# Patient Record
Sex: Female | Born: 1985 | Race: Black or African American | Hispanic: No | Marital: Single | Smoking: Never smoker
Health system: Southern US, Community
[De-identification: ages and names within clinical notes are randomized; demographics above are authoritative.]

## PROBLEM LIST (undated history)

## (undated) DIAGNOSIS — E282 Polycystic ovarian syndrome: Secondary | ICD-10-CM

## (undated) DIAGNOSIS — F329 Major depressive disorder, single episode, unspecified: Secondary | ICD-10-CM

## (undated) DIAGNOSIS — F32A Depression, unspecified: Secondary | ICD-10-CM

## (undated) HISTORY — DX: Major depressive disorder, single episode, unspecified: F32.9

## (undated) HISTORY — DX: Depression, unspecified: F32.A

## (undated) HISTORY — PX: OTHER SURGICAL HISTORY: SHX169

## (undated) HISTORY — DX: Polycystic ovarian syndrome: E28.2

---

## 2005-05-15 ENCOUNTER — Inpatient Hospital Stay (HOSPITAL_COMMUNITY): Admission: AD | Admit: 2005-05-15 | Discharge: 2005-05-15 | Payer: Self-pay | Admitting: Obstetrics & Gynecology

## 2007-11-16 ENCOUNTER — Emergency Department (HOSPITAL_COMMUNITY): Admission: EM | Admit: 2007-11-16 | Discharge: 2007-11-16 | Payer: Self-pay | Admitting: Emergency Medicine

## 2008-01-18 ENCOUNTER — Emergency Department (HOSPITAL_COMMUNITY): Admission: EM | Admit: 2008-01-18 | Discharge: 2008-01-18 | Payer: Self-pay | Admitting: Emergency Medicine

## 2008-09-11 ENCOUNTER — Emergency Department (HOSPITAL_COMMUNITY): Admission: EM | Admit: 2008-09-11 | Discharge: 2008-09-11 | Payer: Self-pay | Admitting: Family Medicine

## 2010-05-03 LAB — POCT PREGNANCY, URINE: Preg Test, Ur: NEGATIVE

## 2010-05-03 LAB — POCT I-STAT, CHEM 8
Creatinine, Ser: 0.7 mg/dL (ref 0.4–1.2)
Potassium: 4 mEq/L (ref 3.5–5.1)
Sodium: 140 mEq/L (ref 135–145)
TCO2: 26 mmol/L (ref 0–100)

## 2010-08-15 IMAGING — CR DG CHEST 2V
2 series · 2 of 2 positions shown · non-contrast
Comparison: None

CLINICAL DATA: Chest pain

CHEST - 2 VIEW

[w chest pa]
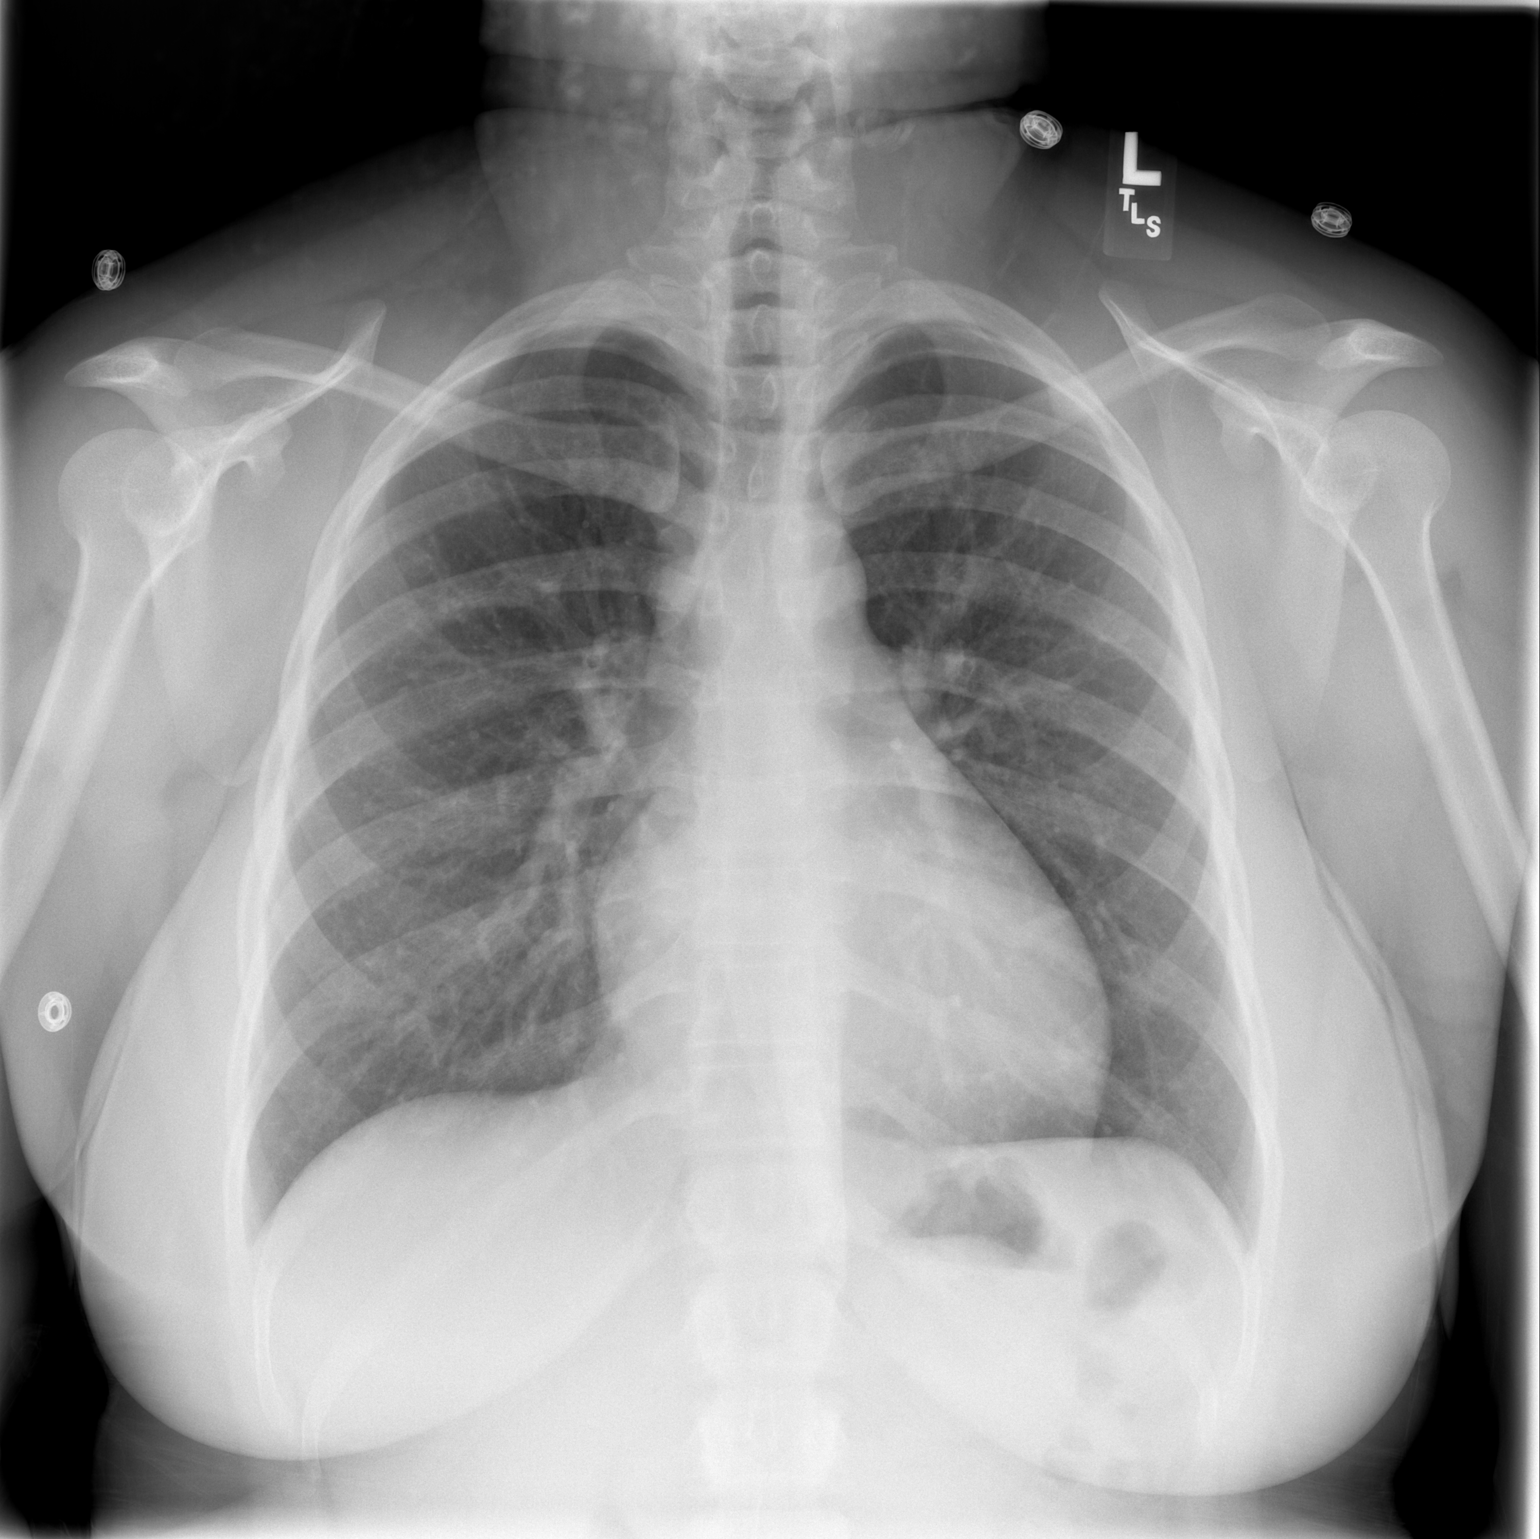

[w chest lat]
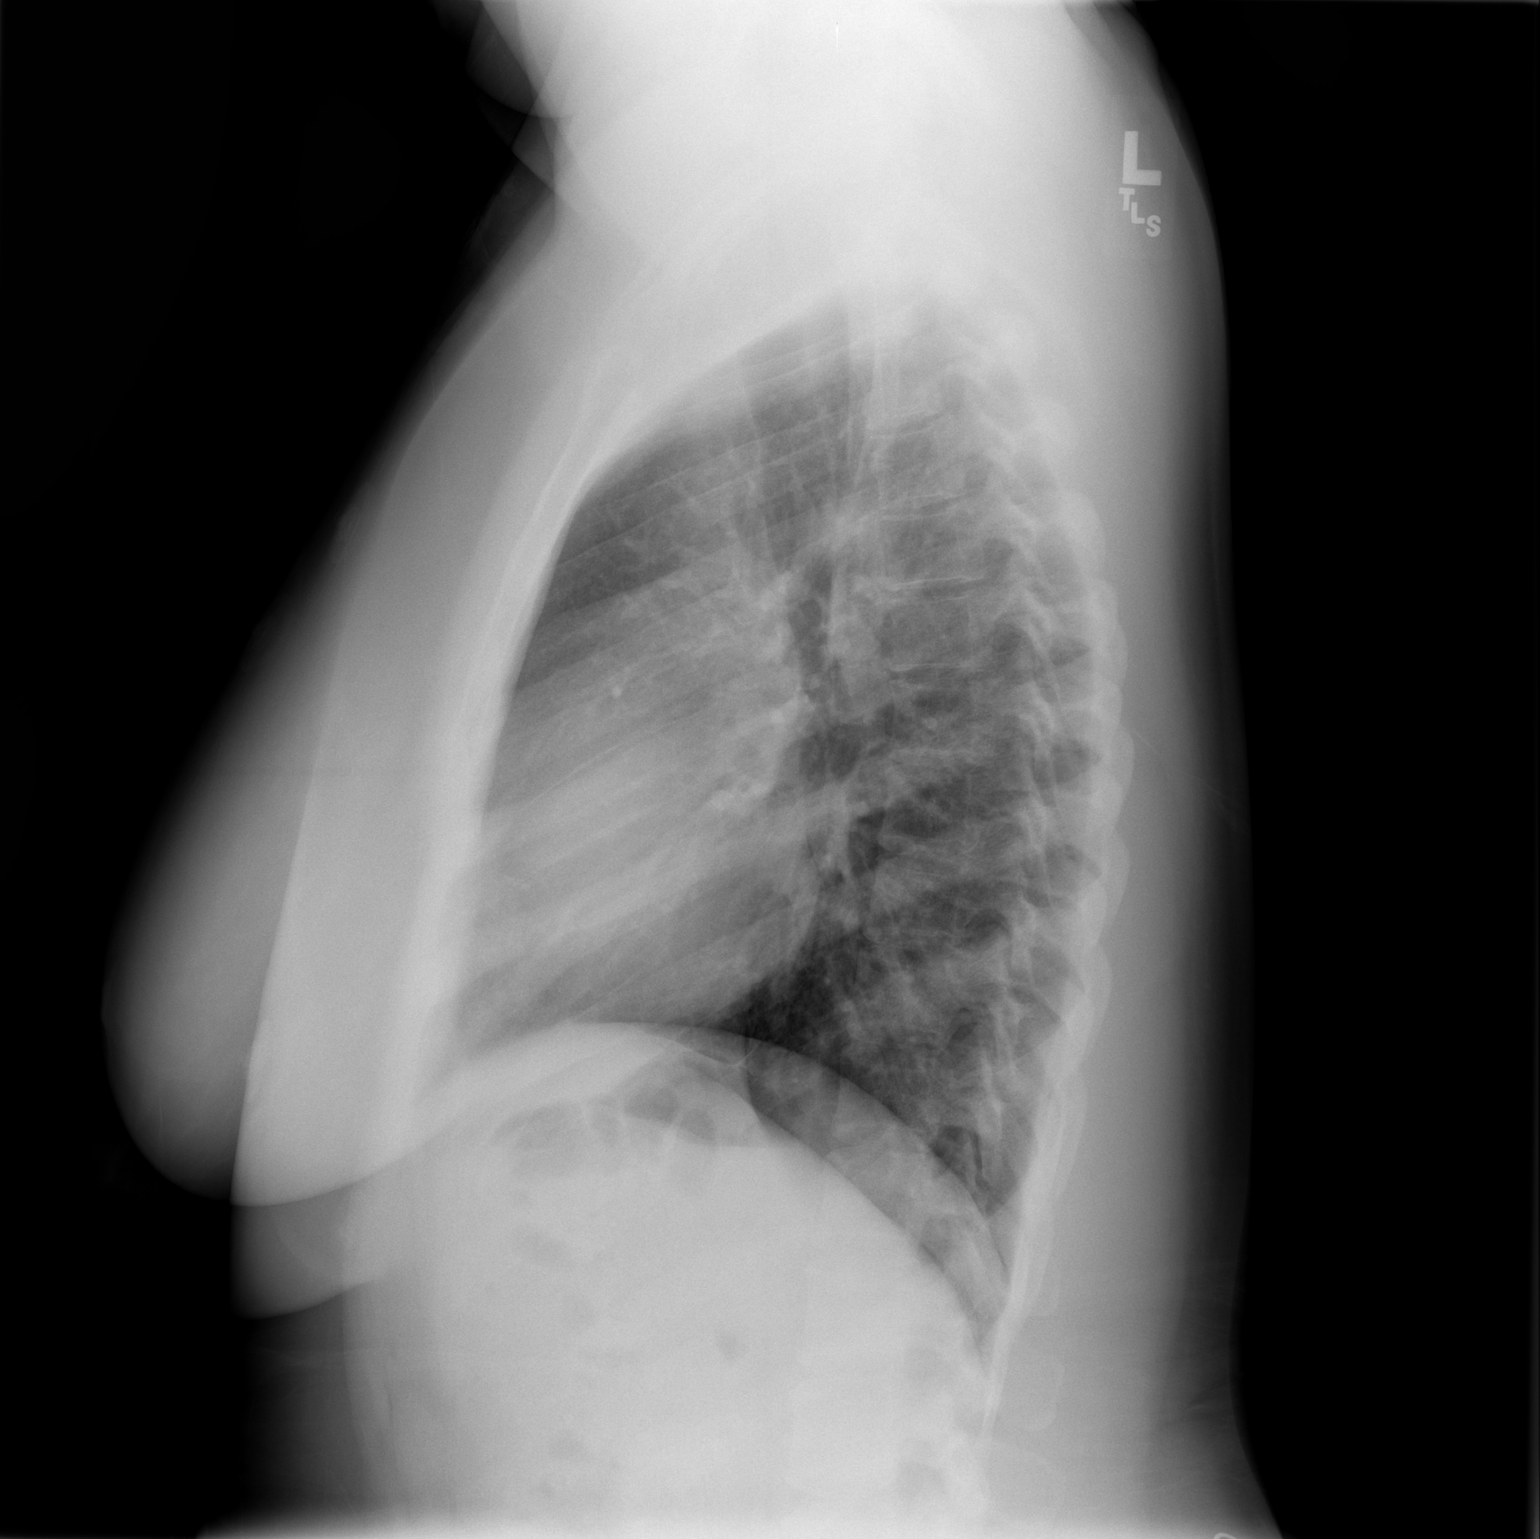

[2 of 2 positions shown; findings below may reference images not displayed]

FINDINGS: Heart size is normal.  The mediastinum is unremarkable.
Lungs are clear.  No effusions.  Bony structures unremarkable.
IMPRESSION: Normal chest

## 2011-05-14 ENCOUNTER — Ambulatory Visit: Payer: PRIVATE HEALTH INSURANCE | Admitting: Family Medicine

## 2011-05-14 VITALS — BP 127/84 | HR 109 | Temp 100.0°F | Resp 18 | Ht 63.5 in | Wt 225.0 lb

## 2011-05-14 DIAGNOSIS — R509 Fever, unspecified: Secondary | ICD-10-CM

## 2011-05-14 DIAGNOSIS — J029 Acute pharyngitis, unspecified: Secondary | ICD-10-CM

## 2011-05-14 LAB — POCT CBC
Granulocyte percent: 68.9 %G (ref 37–80)
HCT, POC: 39.6 % (ref 37.7–47.9)
Hemoglobin: 12.8 g/dL (ref 12.2–16.2)
MCH, POC: 26.4 pg — AB (ref 27–31.2)
MCHC: 32.3 g/dL (ref 31.8–35.4)
MCV: 81.7 fL (ref 80–97)
POC MID %: 8.3 %M (ref 0–12)
RBC: 4.85 M/uL (ref 4.04–5.48)
RDW, POC: 14.7 %
WBC: 6.7 10*3/uL (ref 4.6–10.2)

## 2011-05-14 MED ORDER — MELOXICAM 7.5 MG PO TABS: 7.5000 mg | ORAL_TABLET | Freq: Two times a day (BID) | ORAL | Status: AC

## 2011-05-14 MED ORDER — AZITHROMYCIN 250 MG PO TABS: ORAL_TABLET | ORAL | Status: AC

## 2011-05-14 NOTE — Progress Notes (Signed)
  Subjective:    Patient ID: Connie Dennis, female    DOB: 1985/10/08, 26 y.o.   MRN: [id number]  HPI  Patient presents 2 day history of fever, chills, ST and myalgas  Difficult to swallow, occ cough Malaise  Works at [employer]; multiple sick contacts            Review of Systems     Objective:   Physical Exam  Constitutional: She appears well-developed.  HENT:  Nose: Rhinorrhea (clear) present.  Mouth/Throat: Posterior oropharyngeal erythema: clear PND.  Eyes: Conjunctivae are normal.  Neck: Neck supple.  Cardiovascular: Normal rate, regular rhythm and normal heart sounds.   Pulmonary/Chest: Effort normal and breath sounds normal.  Lymphadenopathy:    She has cervical adenopathy (shoddly anterior nodes).  Neurological: She is alert.  Skin: No rash noted.          Results for orders placed in visit on 05/14/11  POCT CBC      Component Value Range   WBC 6.7  4.6 - 10.2 (K/uL)   Lymph, poc 1.5  0.6 - 3.4    POC LYMPH PERCENT 22.8  10 - 50 (%L)   MID (cbc) 0.6  0 - 0.9    POC MID % 8.3  0 - 12 (%M)   POC Granulocyte 4.6  2 - 6.9    Granulocyte percent 68.9  37 - 80 (%G)   RBC 4.85  4.04 - 5.48 (M/uL)   Hemoglobin 12.8  12.2 - 16.2 (g/dL)   HCT, POC 30.8  65.7 - 47.9 (%)   MCV 81.7  80 - 97 (fL)   MCH, POC 26.4 (*) 27 - 31.2 (pg)   MCHC 32.3  31.8 - 35.4 (g/dL)   RDW, POC 84.6     Platelet Count, POC 288  142 - 424 (K/uL)   MPV 9.2  0 - 99.8 (fL)    Assessment & Plan:   1. Fever  POCT CBC, meloxicam (MOBIC) 7.5 MG tablet  2. Pharyngitis  azithromycin (ZITHROMAX) 250 MG tablet, meloxicam (MOBIC) 7.5 MG tablet   Anticipatory guidance See meds on AVS

## 2013-07-23 ENCOUNTER — Emergency Department (HOSPITAL_COMMUNITY): Payer: BC Managed Care – PPO

## 2013-07-23 ENCOUNTER — Emergency Department (HOSPITAL_COMMUNITY)
Admission: EM | Admit: 2013-07-23 | Discharge: 2013-07-23 | Disposition: A | Discharge status: Home / Self Care | Payer: BC Managed Care – PPO | Attending: Emergency Medicine | Admitting: Emergency Medicine

## 2013-07-23 ENCOUNTER — Encounter (HOSPITAL_COMMUNITY): Payer: Self-pay | Admitting: Emergency Medicine

## 2013-07-23 DIAGNOSIS — Z87891 Personal history of nicotine dependence: Secondary | ICD-10-CM | POA: Insufficient documentation

## 2013-07-23 DIAGNOSIS — E669 Obesity, unspecified: Secondary | ICD-10-CM | POA: Insufficient documentation

## 2013-07-23 DIAGNOSIS — R0789 Other chest pain: Secondary | ICD-10-CM

## 2013-07-23 DIAGNOSIS — I517 Cardiomegaly: Secondary | ICD-10-CM

## 2013-07-23 LAB — COMPREHENSIVE METABOLIC PANEL
ALK PHOS: 91 U/L (ref 39–117)
ALT: 16 U/L (ref 0–35)
AST: 14 U/L (ref 0–37)
Albumin: 3.4 g/dL — ABNORMAL LOW (ref 3.5–5.2)
BUN: 10 mg/dL (ref 6–23)
CALCIUM: 9.1 mg/dL (ref 8.4–10.5)
CO2: 26 mEq/L (ref 19–32)
CREATININE: 0.74 mg/dL (ref 0.50–1.10)
Chloride: 99 mEq/L (ref 96–112)
GFR calc Af Amer: >90 mL/min (ref >90)
Glucose, Bld: 278 mg/dL — ABNORMAL HIGH (ref 70–99)
POTASSIUM: 4.1 meq/L (ref 3.7–5.3)
SODIUM: 136 meq/L — AB (ref 137–147)
Total Bilirubin: <0.2 mg/dL — ABNORMAL LOW (ref 0.3–1.2)
Total Protein: 7.9 g/dL (ref 6.0–8.3)

## 2013-07-23 LAB — CBC
HCT: 34.6 % — ABNORMAL LOW (ref 36.0–46.0)
Hemoglobin: 11.3 g/dL — ABNORMAL LOW (ref 12.0–15.0)
MCH: 24.6 pg — ABNORMAL LOW (ref 26.0–34.0)
MCHC: 32.7 g/dL (ref 30.0–36.0)
MCV: 75.2 fL — ABNORMAL LOW (ref 78.0–100.0)
PLATELETS: 296 10*3/uL (ref 150–400)
RBC: 4.6 MIL/uL (ref 3.87–5.11)
RDW: 14.1 % (ref 11.5–15.5)
WBC: 7.6 10*3/uL (ref 4.0–10.5)

## 2013-07-23 LAB — I-STAT TROPONIN, ED: Troponin i, poc: 0 ng/mL (ref 0.00–0.08)

## 2013-07-23 LAB — PRO B NATRIURETIC PEPTIDE: PRO B NATRI PEPTIDE: 12 pg/mL (ref 0–125)

## 2013-07-23 NOTE — Discharge Instructions (Signed)
If you were given medicines take as directed.  If you are on coumadin or contraceptives realize their levels and effectiveness is altered by many different medicines.  If you have any reaction (rash, tongues swelling, other) to the medicines stop taking and see a physician.   Please follow up as directed and return to the ER or see a physician for new or worsening symptoms.  Thank you. Filed Vitals:   07/23/13 0155  BP: 149/86  Pulse: 94  Temp: 98.5 F (36.9 C)  TempSrc: Oral  Resp: 20  Weight: 245 lb (111.131 kg)  SpO2: 100%    Chest Pain (Nonspecific) It is often hard to give a diagnosis for the cause of chest pain. There is always a chance that your pain could be related to something serious, such as a heart attack or a blood clot in the lungs. You need to follow up with your doctor. HOME CARE  If antibiotic medicine was given, take it as directed by your doctor. Finish the medicine even if you start to feel better.  For the next few days, avoid activities that bring on chest pain. Continue physical activities as told by your doctor.  Do not use any tobacco products. This includes cigarettes, chewing tobacco, and e-cigarettes.  Avoid drinking alcohol.  Only take medicine as told by your doctor.  Follow your doctor's suggestions for more testing if your chest pain does not go away.  Keep all doctor visits you made. GET HELP IF:  Your chest pain does not go away, even after treatment.  You have a rash with blisters on your chest.  You have a fever. GET HELP RIGHT AWAY IF:   You have more pain or pain that spreads to your arm, neck, jaw, back, or belly (abdomen).  You have shortness of breath.  You cough more than usual or cough up blood.  You have very bad back or belly pain.  You feel sick to your stomach (nauseous) or throw up (vomit).  You have very bad weakness.  You pass out (faint).  You have chills. This is an emergency. Do not wait to see if the  problems will go away. Call your local emergency services (911 in [place] Do not drive yourself to the hospital. MAKE SURE YOU:   Understand these instructions.  Will watch your condition.  Will get help right away if you are not doing well or get worse. Document Released: 07/01/2007 Document Revised: 01/17/2013 Document Reviewed: 07/01/2007 [facility] Patient Information 2015 [place], [place]. This information is not intended to replace advice given to you by your health care provider. Make sure you discuss any questions you have with your health care provider.

## 2013-07-23 NOTE — ED Notes (Signed)
Pt arrived to the ED with a complaint of having chest pain.  Pt began having chest pain 20 minutes ago.  Pt states the pain is more like a cramping sensation.  Pt states she got short of breath two days ago.  Pt states the shortness of breath after she eats.

## 2013-07-23 NOTE — ED Provider Notes (Signed)
CSN: [id number]     Arrival date & time 07/23/13  0149 History   First MD Initiated Contact with Patient 07/23/13 0234     Chief Complaint  Patient presents with  . Chest Pain     (Consider location/radiation/quality/duration/timing/severity/associated sxs/prior Treatment) HPI Comments: 28 year old female with PCO S. and obesity presents after 3 minute episode of right-sided chest tightness that resolved on its own. No recent or current exertional symptoms or diaphoresis. No known heart problems or blood clot history.Patient denies blood clot history, active cancer, recent major trauma or surgery, unilateral leg swelling/ pain, recent long travel, hemoptysis or oral contraceptives. Patient currently has no symptoms. Patient had mild shortness of breath intermittent past 2 days that occurs after eating. Mild bloating sensation upper abdomen without known gallstone or gallbladder problems.   Patient is a 28 y.o. female presenting with chest pain. The history is provided by the patient.  Chest Pain Associated symptoms: shortness of breath   Associated symptoms: no abdominal pain, no back pain, no fever, no headache and not vomiting     No past medical history on file. No past surgical history on file. No family history on file. History  Substance Use Topics  . Smoking status: Former Games developermoker  . Smokeless tobacco: Not on file  . Alcohol Use: Yes   OB History   Grav Para Term Preterm Abortions TAB SAB Ect Mult Living                 Review of Systems  Constitutional: Negative for fever and chills.  HENT: Negative for congestion.   Eyes: Negative for visual disturbance.  Respiratory: Positive for shortness of breath.   Cardiovascular: Positive for chest pain. Negative for leg swelling.  Gastrointestinal: Negative for vomiting and abdominal pain.  Genitourinary: Negative for dysuria and flank pain.  Musculoskeletal: Negative for back pain, neck pain and neck stiffness.  Skin:  Negative for rash.  Neurological: Negative for light-headedness and headaches.      Allergies  Review of patient's allergies indicates no known allergies.  Home Medications   Prior to Admission medications   Not on File   BP 149/86  Pulse 94  Temp(Src) 98.5 F (36.9 C) (Oral)  Resp 20  Wt 245 lb (111.131 kg)  SpO2 100%  LMP 07/23/2013 Physical Exam  Nursing note and vitals reviewed. Constitutional: She is oriented to person, place, and time. She appears well-developed and well-nourished.  HENT:  Head: Normocephalic and atraumatic.  Eyes: Conjunctivae are normal. Right eye exhibits no discharge. Left eye exhibits no discharge.  Neck: Normal range of motion. Neck supple. No tracheal deviation present.  Cardiovascular: Normal rate, regular rhythm and intact distal pulses.   Pulmonary/Chest: Effort normal and breath sounds normal.  Abdominal: Soft. She exhibits no distension. There is no tenderness. There is no guarding.  Musculoskeletal: She exhibits no edema and no tenderness.  Neurological: She is alert and oriented to person, place, and time.  Skin: Skin is warm. No rash noted.  Psychiatric: She has a normal mood and affect.    ED Course  Procedures (including critical care time)  EMERGENCY DEPARTMENT BILIARY ULTRASOUND INTERPRETATION "Study: Limited Abdominal Ultrasound of the gallbladder and common bile duct."  INDICATIONS: upper abd fullness with eating Indication: Multiple views of the gallbladder and common bile duct were obtained in real-time with a Multi-frequency probe." PERFORMED BY:  Myself IMAGES ARCHIVED?: Yes FINDINGS: Gallstones absent and Gallbladder wall normal in thickness LIMITATIONS: Body Habitus and Bowel Gas INTERPRETATION: Normal  Labs Review Labs Reviewed  CBC - Abnormal; Notable for the following:    Hemoglobin 11.3 (*)    HCT 34.6 (*)    MCV 75.2 (*)    MCH 24.6 (*)    All other components within normal limits  PRO B NATRIURETIC  PEPTIDE  PREGNANCY, URINE  COMPREHENSIVE METABOLIC PANEL  I-STAT TROPOININ, ED    Imaging Review Dg Chest 2 View  07/23/2013   CLINICAL DATA:  Chest pain, shortness of breath and dizziness.  EXAM: CHEST  2 VIEW  COMPARISON:  Chest radiograph November 16, 2007  FINDINGS: Cardiac silhouette is upper limits of normal in size, mediastinal silhouette is nonsuspicious. Central prominence of the bronchovascular markings without pleural effusions or focal consolidations. Trachea projects midline and there is no pneumothorax. Soft tissue planes and included osseous structures are nonsuspicious.  IMPRESSION: Borderline cardiomegaly. Prominent central bronchovascular markings could reflect venous congestion, reactive airway disease or possible bronchitis. No focal consolidation.   Electronically Signed   By: Awilda Metroourtnay  Bloomer   On: 07/23/2013 03:39     EKG Interpretation   Date/Time:  Sunday July 23 2013 02:06:39 EDT Ventricular Rate:  100 PR Interval:  153 QRS Duration: 79 QT Interval:  354 QTC Calculation: 457 R Axis:   54 Text Interpretation:  No acute findings Confirmed by Nishat Livingston  MD, Torian Thoennes  ([id number]) on 07/23/2013 4:22:38 AM      MDM   Final diagnoses:  Atypical chest pain  Cardiomegaly   Well-appearing female with obesity as risk factor for cardiac disease and no risk factors for blood clots. Currently asymptomatic. Patient is perc negative (hr when I was in the room 90) no PE risks and no d-dimer indicated at this time. EKG reviewed no acute findings. Plan for one screening troponin at approximately 2 hours which will be approximately 3 hours since her brief atypical chest tightness. Patient very well-appearing in ER and I discussed outpatient followup. Bedside ultrasound done no gallstones visualized.  Patient has no chest pain the ER no symptoms. Followup outpatient discussed Results and differential diagnosis were discussed with the patient/parent/guardian. Close follow up outpatient  was discussed, comfortable with the plan.   Medications - No data to display  Filed Vitals:   07/23/13 0155  BP: 149/86  Pulse: 94  Temp: 98.5 F (36.9 C)  TempSrc: Oral  Resp: 20  Weight: 245 lb (111.131 kg)  SpO2: 100%        Enid SkeensJoshua M Gabriella Woodhead, MD 07/23/13 0423

## 2013-07-25 ENCOUNTER — Encounter: Payer: Self-pay | Admitting: Cardiovascular Disease

## 2013-07-25 ENCOUNTER — Emergency Department (HOSPITAL_COMMUNITY)
Admission: EM | Admit: 2013-07-25 | Discharge: 2013-07-25 | Disposition: A | Discharge status: Home / Self Care | Payer: BC Managed Care – PPO | Attending: Emergency Medicine | Admitting: Emergency Medicine

## 2013-07-25 ENCOUNTER — Ambulatory Visit (INDEPENDENT_AMBULATORY_CARE_PROVIDER_SITE_OTHER): Payer: BC Managed Care – PPO | Admitting: Cardiovascular Disease

## 2013-07-25 ENCOUNTER — Encounter (HOSPITAL_COMMUNITY): Payer: Self-pay | Admitting: Emergency Medicine

## 2013-07-25 VITALS — BP 110/76 | HR 84 | Ht 63.0 in | Wt 243.0 lb

## 2013-07-25 DIAGNOSIS — R110 Nausea: Secondary | ICD-10-CM | POA: Insufficient documentation

## 2013-07-25 DIAGNOSIS — Z79899 Other long term (current) drug therapy: Secondary | ICD-10-CM | POA: Insufficient documentation

## 2013-07-25 DIAGNOSIS — R141 Gas pain: Secondary | ICD-10-CM | POA: Insufficient documentation

## 2013-07-25 DIAGNOSIS — R072 Precordial pain: Secondary | ICD-10-CM

## 2013-07-25 DIAGNOSIS — Z87891 Personal history of nicotine dependence: Secondary | ICD-10-CM | POA: Insufficient documentation

## 2013-07-25 DIAGNOSIS — R143 Flatulence: Secondary | ICD-10-CM

## 2013-07-25 DIAGNOSIS — I517 Cardiomegaly: Secondary | ICD-10-CM

## 2013-07-25 DIAGNOSIS — R0789 Other chest pain: Secondary | ICD-10-CM

## 2013-07-25 DIAGNOSIS — R079 Chest pain, unspecified: Secondary | ICD-10-CM | POA: Insufficient documentation

## 2013-07-25 DIAGNOSIS — R142 Eructation: Secondary | ICD-10-CM

## 2013-07-25 LAB — CBC
HEMATOCRIT: 36.1 % (ref 36.0–46.0)
Hemoglobin: 12.1 g/dL (ref 12.0–15.0)
MCH: 25.2 pg — ABNORMAL LOW (ref 26.0–34.0)
MCHC: 33.5 g/dL (ref 30.0–36.0)
MCV: 75.1 fL — AB (ref 78.0–100.0)
Platelets: 321 10*3/uL (ref 150–400)
RBC: 4.81 MIL/uL (ref 3.87–5.11)
RDW: 14.2 % (ref 11.5–15.5)
WBC: 9 10*3/uL (ref 4.0–10.5)

## 2013-07-25 LAB — BASIC METABOLIC PANEL
BUN: 10 mg/dL (ref 6–23)
CHLORIDE: 97 meq/L (ref 96–112)
CO2: 25 mEq/L (ref 19–32)
Calcium: 9.3 mg/dL (ref 8.4–10.5)
Creatinine, Ser: 0.79 mg/dL (ref 0.50–1.10)
GFR calc non Af Amer: >90 mL/min (ref >90)
GLUCOSE: 152 mg/dL — AB (ref 70–99)
POTASSIUM: 3.6 meq/L — AB (ref 3.7–5.3)
SODIUM: 135 meq/L — AB (ref 137–147)

## 2013-07-25 LAB — I-STAT TROPONIN, ED: TROPONIN I, POC: 0.01 ng/mL (ref 0.00–0.08)

## 2013-07-25 LAB — PRO B NATRIURETIC PEPTIDE: Pro B Natriuretic peptide (BNP): 7 pg/mL (ref 0–125)

## 2013-07-25 MED ORDER — POTASSIUM CHLORIDE CRYS ER 20 MEQ PO TBCR
40.0000 meq | EXTENDED_RELEASE_TABLET | Freq: Once | ORAL | Status: AC
  Administered 2013-07-25: 40 meq via ORAL
  Filled 2013-07-25: qty 2

## 2013-07-25 NOTE — Patient Instructions (Addendum)
Your physician recommends that you schedule a follow-up appointment in:  About 6- 8  weeks.   Your physician has requested that you have an echocardiogram. Echocardiography is a painless test that uses sound waves to create images of your heart. It provides your doctor with information about the size and shape of your heart and how well your heart's chambers and valves are working. This procedure takes approximately one hour. There are no restrictions for this procedure.   Try over the counter Prilosec if needed.

## 2013-07-25 NOTE — Progress Notes (Signed)
     History of Present Illness: 28 yo female with history of obesity, polycystic ovarian syndrome here today as a new patient for evaluation of chest pain. She tells me that she had dizziness and SOB beginning four days ago. She was seen in the [facility] two days ago and had a normal evaluation. She had onset of chest pain last night while lying in bed. The pain was located in the right and left chest wall and happened   Primary Care Physician: None  Past Medical History  Diagnosis Date  . Polycystic ovarian syndrome     Past Surgical History  Procedure Laterality Date  . None      Current Outpatient Prescriptions  Medication Sig Dispense Refill  . GARCINIA CAMBOGIA-CHROMIUM PO Take 1 capsule by mouth 2 (two) times daily.       No current facility-administered medications for this visit.    No Known Allergies  History   Social History  . Marital Status: Single    Spouse Name: N/A    Number of Children: 0  . Years of Education: N/A   Occupational History  . High School Teacher-Social Studie/English    Social History Main Topics  . Smoking status: Never Smoker   . Smokeless tobacco: Not on file  . Alcohol Use: 0.5 oz/week    1 drink(s) per week  . Drug Use: No  . Sexual Activity: Yes   Other Topics Concern  . Not on file   Social History Narrative  . No narrative on file    Family History  Problem Relation Age of Onset  . Diabetes Paternal Grandmother   . Cancer Mother     Breast cancer  . CAD Neg Hx     Review of Systems:  As stated in the HPI and otherwise negative.   BP 110/76  Pulse 84  Ht 5\' 3"  (1.6 m)  Wt 243 lb (110.224 kg)  BMI 43.06 kg/m2  LMP 07/23/2013  Physical Examination: General: Well developed, well nourished, NAD HEENT: OP clear, mucus membranes moist SKIN: warm, dry. No rashes. Neuro: No focal deficits Musculoskeletal: Muscle strength 5/5 all ext Psychiatric: Mood and affect normal Neck: No JVD, no carotid bruits, no  thyromegaly, no lymphadenopathy. Lungs:Clear bilaterally, no wheezes, rhonci, crackles Cardiovascular: Regular rate and rhythm. No murmurs, gallops or rubs. Abdomen:Soft. Bowel sounds present. Non-tender.  Extremities: No lower extremity edema. Pulses are 2 + in the bilateral DP/PT.  EKG: NSR, no ischemic changes (EKG from ED this am)  Assessment and Plan:   1. Chest pain: Atypical. I do not think it is cardiac related. Occurred during rest after heavy meal while lying in bed. LIkely GERD. Will have her try Prilosec OTC. Will arrange echo to assess LV function and LV size.   2. Cardiomegaly on Chest xray: Will assess LV size with echo.

## 2013-07-25 NOTE — ED Notes (Signed)
Patient c/o bilateral chest pain radiating into her underarms. Patient seen 2 days ago for same, tonight she states the pain is burning and awakened her suddenly @ 1 hour ago.

## 2013-07-25 NOTE — ED Notes (Signed)
Pt reports waking up about an hour ago with chest pain/tightness, sts had similar episode 2 days ago and was told her heart is enlarged. Has an appointment with cardiologist this week. Pt denies any pain at this time, sts it's intermittent.

## 2013-07-25 NOTE — ED Provider Notes (Signed)
Medical screening examination/treatment/procedure(s) were performed by non-physician practitioner and as supervising physician I was immediately available for consultation/collaboration.   EKG Interpretation   Date/Time:  Tuesday July 25 2013 01:57:27 EDT Ventricular Rate:  83 PR Interval:  145 QRS Duration: 81 QT Interval:  365 QTC Calculation: 429 R Axis:   48 Text Interpretation:  Sinus rhythm Baseline wander in lead(s) V2 No  significant change since last tracing Confirmed by OTTER  MD, OLGA ([id number])  on 07/25/2013 4:45:24 AM       Olivia Mackielga M Otter, MD 07/25/13 [phone]

## 2013-07-25 NOTE — Discharge Instructions (Signed)
Your testing today has not shown any signs for a concerning or emergent cause for your chest pain. Please followup with your cardiologist today as planned.    Chest Pain (Nonspecific) It is often hard to give a specific diagnosis for the cause of chest pain. There is always a chance that your pain could be related to something serious, such as a heart attack or a blood clot in the lungs. You need to follow up with your health care provider for further evaluation. CAUSES   Heartburn.  Pneumonia or bronchitis.  Anxiety or stress.  Inflammation around your heart (pericarditis) or lung (pleuritis or pleurisy).  A blood clot in the lung.  A collapsed lung (pneumothorax). It can develop suddenly on its own (spontaneous pneumothorax) or from trauma to the chest.  Shingles infection (herpes zoster virus). The chest wall is composed of bones, muscles, and cartilage. Any of these can be the source of the pain.  The bones can be bruised by injury.  The muscles or cartilage can be strained by coughing or overwork.  The cartilage can be affected by inflammation and become sore (costochondritis). DIAGNOSIS  Lab tests or other studies may be needed to find the cause of your pain. Your health care provider may have you take a test called an ambulatory electrocardiogram (ECG). An ECG records your heartbeat patterns over a 24-hour period. You may also have other tests, such as:  Transthoracic echocardiogram (TTE). During echocardiography, sound waves are used to evaluate how blood flows through your heart.  Transesophageal echocardiogram (TEE).  Cardiac monitoring. This allows your health care provider to monitor your heart rate and rhythm in real time.  Holter monitor. This is a portable device that records your heartbeat and can help diagnose heart arrhythmias. It allows your health care provider to track your heart activity for several days, if needed.  Stress tests by exercise or by giving  medicine that makes the heart beat faster. TREATMENT   Treatment depends on what may be causing your chest pain. Treatment may include:  Acid blockers for heartburn.  Anti-inflammatory medicine.  Pain medicine for inflammatory conditions.  Antibiotics if an infection is present.  You may be advised to change lifestyle habits. This includes stopping smoking and avoiding alcohol, caffeine, and chocolate.  You may be advised to keep your head raised (elevated) when sleeping. This reduces the chance of acid going backward from your stomach into your esophagus. Most of the time, nonspecific chest pain will improve within 2-3 days with rest and mild pain medicine.  HOME CARE INSTRUCTIONS   If antibiotics were prescribed, take them as directed. Finish them even if you start to feel better.  For the next few days, avoid physical activities that bring on chest pain. Continue physical activities as directed.  Do not use any tobacco products, including cigarettes, chewing tobacco, or electronic cigarettes.  Avoid drinking alcohol.  Only take medicine as directed by your health care provider.  Follow your health care provider's suggestions for further testing if your chest pain does not go away.  Keep any follow-up appointments you made. If you do not go to an appointment, you could develop lasting (chronic) problems with pain. If there is any problem keeping an appointment, call to reschedule. SEEK MEDICAL CARE IF:   Your chest pain does not go away, even after treatment.  You have a rash with blisters on your chest.  You have a fever. SEEK IMMEDIATE MEDICAL CARE IF:   You have increased  chest pain or pain that spreads to your arm, neck, jaw, back, or abdomen.  You have shortness of breath.  You have an increasing cough, or you cough up blood.  You have severe back or abdominal pain.  You feel nauseous or vomit.  You have severe weakness.  You faint.  You have  chills. This is an emergency. Do not wait to see if the pain will go away. Get medical help at once. Call your local emergency services (911 in [place] Do not drive yourself to the hospital. MAKE SURE YOU:   Understand these instructions.  Will watch your condition.  Will get help right away if you are not doing well or get worse. Document Released: 10/22/2004 Document Revised: 01/17/2013 Document Reviewed: 08/18/2007 [facility] Patient Information 2015 [place], [place]. This information is not intended to replace advice given to you by your health care provider. Make sure you discuss any questions you have with your health care provider.

## 2013-07-25 NOTE — ED Provider Notes (Signed)
CSN: [id number]     Arrival date & time 07/25/13  0150 History   First MD Initiated Contact with Patient 07/25/13 0201     Chief Complaint  Patient presents with  . Chest Pain   HPI  History provided by the patient. Patient is a 28 year old female with no significant PMH presenting with complaints of intermittent episodic chest pains. Patient reports waking up around 1 AM with a burning chest pain in the left side radiating up and over her shoulder to the back. Symptoms lasted only a few minutes and resolved. Since then she has had intermittent similar burning pains to bilateral left and right chest radiating up and over both shoulders to the back similar to a backpack strap. Symptoms last only a few minutes and then resolve for about 10 minutes and may return at any time. They do not seem worse with any bending moving or twisting. They do not seem worse with any deep breathing. Denies any associated shortness of breath. Did have some belching and slight nausea. No vomiting. No diaphoresis. She has not had any recent long travel. No swelling in the extremities. No weight changes. No orthopnea or PND.    History reviewed. No pertinent past medical history. History reviewed. No pertinent past surgical history. No family history on file. History  Substance Use Topics  . Smoking status: Former Games developermoker  . Smokeless tobacco: Not on file  . Alcohol Use: No   OB History   Grav Para Term Preterm Abortions TAB SAB Ect Mult Living                 Review of Systems  Constitutional: Negative for fever, chills and diaphoresis.  HENT: Negative for congestion and rhinorrhea.   Respiratory: Negative for cough and shortness of breath.   Cardiovascular: Positive for chest pain. Negative for palpitations and leg swelling.  Gastrointestinal: Positive for nausea. Negative for vomiting, abdominal pain and diarrhea.  All other systems reviewed and are negative.     Allergies  Review of patient's  allergies indicates no known allergies.  Home Medications   Prior to Admission medications   Medication Sig Start Date End Date Taking? Authorizing Provider  GARCINIA CAMBOGIA-CHROMIUM PO Take 1 capsule by mouth 2 (two) times daily.    Historical Provider, MD   BP 139/92  Pulse 83  Temp(Src) 97.3 F (36.3 C) (Oral)  Resp 13  Ht 5\' 3"  (1.6 m)  Wt 245 lb (111.131 kg)  BMI 43.41 kg/m2  SpO2 100%  LMP 07/23/2013 Physical Exam  Nursing note and vitals reviewed. Constitutional: She is oriented to person, place, and time. She appears well-developed and well-nourished. No distress.  HENT:  Head: Normocephalic.  Cardiovascular: Normal rate and regular rhythm.   Pulmonary/Chest: Effort normal and breath sounds normal. No respiratory distress. She has no wheezes. She has no rales.  Abdominal: Soft. There is no tenderness.  Neurological: She is alert and oriented to person, place, and time.  Skin: Skin is warm and dry. No rash noted.  Psychiatric: She has a normal mood and affect. Her behavior is normal.    ED Course  Procedures  COORDINATION OF CARE:  Nursing notes reviewed. Vital signs reviewed. Initial pt interview and examination performed.   Filed Vitals:   07/25/13 0156  BP: 139/92  Pulse: 83  Temp: 97.3 F (36.3 C)  TempSrc: Oral  Resp: 13  Height: 5\' 3"  (1.6 m)  Weight: 245 lb (111.131 kg)  SpO2: 100%    2:39  AM-patient seen and evaluated. Patient well-appearing in no acute distress. She has had atypical intermittent brief bilateral chest burning for the past 1-1/2 hours. Was seen for a different chest tightness 2 days ago. Was found to be hyperglycemic with only past history of prediabetes and no treatment. Also having chest x-ray with signs of borderline enlarged heart and possible vascular congestion. Normal BNP at that time. She denies any increased weight gain or swelling of extremities. No shortness of breath or cough her symptoms tonight. Does have slight  hypertension at triage.  Patient with atypical chest pains probable diabetes, hypertension and is obese. Has family history of hypertension but no significant CAD.  Laboratory testing and EKG unremarkable. Patient does report having an appointment with cardiologist later today at 11 AM. This time do not suspect any acute emergent cause for her symptoms. Feel she is stable to return home and followup as planned. She agrees.  Treatment plan initiated: Medications  potassium chloride SA (K-DUR,KLOR-CON) CR tablet 40 mEq (40 mEq Oral Given 07/25/13 0408)    Results for orders placed during the hospital encounter of 07/25/13  CBC      Result Value Ref Range   WBC 9.0  4.0 - 10.5 K/uL   RBC 4.81  3.87 - 5.11 MIL/uL   Hemoglobin 12.1  12.0 - 15.0 g/dL   HCT 16.1  09.6 - 04.5 %   MCV 75.1 (*) 78.0 - 100.0 fL   MCH 25.2 (*) 26.0 - 34.0 pg   MCHC 33.5  30.0 - 36.0 g/dL   RDW 40.9  81.1 - 91.4 %   Platelets 321  150 - 400 K/uL  BASIC METABOLIC PANEL      Result Value Ref Range   Sodium 135 (*) 137 - 147 mEq/L   Potassium 3.6 (*) 3.7 - 5.3 mEq/L   Chloride 97  96 - 112 mEq/L   CO2 25  19 - 32 mEq/L   Glucose, Bld 152 (*) 70 - 99 mg/dL   BUN 10  6 - 23 mg/dL   Creatinine, Ser 7.82  0.50 - 1.10 mg/dL   Calcium 9.3  8.4 - 95.6 mg/dL   GFR calc non Af Amer >90  >90 mL/min   GFR calc Af Amer >90  >90 mL/min  PRO B NATRIURETIC PEPTIDE      Result Value Ref Range   Pro B Natriuretic peptide (BNP) 7.0  0 - 125 pg/mL  I-STAT TROPOININ, ED      Result Value Ref Range   Troponin i, poc 0.01  0.00 - 0.08 ng/mL   Comment 3                 Imaging Review Dg Chest 2 View  07/23/2013   CLINICAL DATA:  Chest pain, shortness of breath and dizziness.  EXAM: CHEST  2 VIEW  COMPARISON:  Chest radiograph November 16, 2007  FINDINGS: Cardiac silhouette is upper limits of normal in size, mediastinal silhouette is nonsuspicious. Central prominence of the bronchovascular markings without pleural effusions  or focal consolidations. Trachea projects midline and there is no pneumothorax. Soft tissue planes and included osseous structures are nonsuspicious.  IMPRESSION: Borderline cardiomegaly. Prominent central bronchovascular markings could reflect venous congestion, reactive airway disease or possible bronchitis. No focal consolidation.   Electronically Signed   By: Awilda Metro   On: 07/23/2013 03:39     Date: 07/25/2013  Rate: 83  Rhythm: normal sinus rhythm  QRS Axis: normal  Intervals: normal  ST/T  Wave abnormalities: normal  Conduction Disutrbances:none  Narrative Interpretation:   Old EKG Reviewed: unchanged    MDM   Final diagnoses:  Atypical chest pain       Angus Sellereter S Dammen, PA-C 07/25/13 [phone]

## 2013-08-09 ENCOUNTER — Ambulatory Visit (HOSPITAL_COMMUNITY): Payer: BC Managed Care – PPO | Attending: Cardiovascular Disease | Admitting: Radiology

## 2013-08-09 DIAGNOSIS — I059 Rheumatic mitral valve disease, unspecified: Secondary | ICD-10-CM | POA: Insufficient documentation

## 2013-08-09 DIAGNOSIS — R072 Precordial pain: Secondary | ICD-10-CM | POA: Insufficient documentation

## 2013-08-09 DIAGNOSIS — I517 Cardiomegaly: Secondary | ICD-10-CM

## 2013-08-09 DIAGNOSIS — I079 Rheumatic tricuspid valve disease, unspecified: Secondary | ICD-10-CM | POA: Insufficient documentation

## 2013-08-09 DIAGNOSIS — E669 Obesity, unspecified: Secondary | ICD-10-CM | POA: Insufficient documentation

## 2013-08-09 NOTE — Progress Notes (Signed)
Echocardiogram performed.  

## 2013-08-11 ENCOUNTER — Telehealth: Payer: Self-pay | Admitting: Cardiovascular Disease

## 2013-08-11 NOTE — Telephone Encounter (Signed)
Patient notified of ECHO results. Patient verbalized understanding and agreement with current treatment plan. Discussed previous findings of heart being "enlarged" and how weight loss would be beneficial for her heart health. Patient requested information from Dr. Clifton JamesMcAlhany regarding how vigorous she can exercise. Routed question to Dr. Clifton JamesMcAlhany and Charlotte CrumbPat Adelman, RN.

## 2013-08-11 NOTE — Telephone Encounter (Signed)
New message ° ° ° ° ° °Want echo results °

## 2013-08-14 NOTE — Telephone Encounter (Signed)
Please see my note attached to the echo. She can return to normal exercise. cdm

## 2013-08-14 NOTE — Telephone Encounter (Signed)
Pt aware that she is to return to normal exercise activity, per MD. Pt verbalized understanding.

## 2013-09-18 ENCOUNTER — Ambulatory Visit: Payer: BC Managed Care – PPO | Admitting: Cardiovascular Disease

## 2013-10-24 ENCOUNTER — Ambulatory Visit (INDEPENDENT_AMBULATORY_CARE_PROVIDER_SITE_OTHER): Payer: BC Managed Care – PPO | Admitting: Cardiovascular Disease

## 2013-10-24 ENCOUNTER — Encounter: Payer: Self-pay | Admitting: Cardiovascular Disease

## 2013-10-24 VITALS — BP 118/70 | HR 90 | Resp 12 | Wt 238.0 lb

## 2013-10-24 DIAGNOSIS — R072 Precordial pain: Secondary | ICD-10-CM

## 2013-10-24 NOTE — Patient Instructions (Signed)
Your physician recommends that you schedule a follow-up appointment as needed with Dr. McAlhany   

## 2013-10-24 NOTE — Progress Notes (Signed)
r    History of Present Illness: 28 yo female with history of obesity, polycystic ovarian syndrome here today for cardiac follow up. I saw her as a new patient for evaluation of chest pain 07/25/13.  She described chest pain, dizziness and SOB beginning four days prior to that visit. [facility] and had a normal evaluation. I felt that her chest discomfort that only occurred while lying in bed was likely GERD related. I started her on a PPI and arranged an echo. Echo 08/09/13 with normal LV function, LVEF=55-60%. No valve disease.   She is here today for follow up. She is feeling much better. No chest pain or SOB.   Primary Care Physician: None  Past Medical History  Diagnosis Date  . Polycystic ovarian syndrome     Past Surgical History  Procedure Laterality Date  . None      Current Outpatient Prescriptions  Medication Sig Dispense Refill  . GARCINIA CAMBOGIA-CHROMIUM PO Take 1 capsule by mouth 2 (two) times daily.       No current facility-administered medications for this visit.    No Known Allergies  History   Social History  . Marital Status: Single    Spouse Name: N/A    Number of Children: 0  . Years of Education: N/A   Occupational History  . High School Teacher-Social Studie/English    Social History Main Topics  . Smoking status: Never Smoker   . Smokeless tobacco: Not on file  . Alcohol Use: 0.5 oz/week    1 drink(s) per week  . Drug Use: No  . Sexual Activity: Yes   Other Topics Concern  . Not on file   Social History Narrative  . No narrative on file    Family History  Problem Relation Age of Onset  . Diabetes Paternal Grandmother   . Cancer Mother     Breast cancer  . CAD Neg Hx     Review of Systems:  As stated in the HPI and otherwise negative.   BP 118/70  Pulse 90  Resp 12  Wt 238 lb (107.956 kg)  Physical Examination: General: Well developed, well nourished, NAD HEENT: OP clear, mucus membranes  moist SKIN: warm, dry. No rashes. Neuro: No focal deficits Musculoskeletal: Muscle strength 5/5 all ext Psychiatric: Mood and affect normal Neck: No JVD, no carotid bruits, no thyromegaly, no lymphadenopathy. Lungs:Clear bilaterally, no wheezes, rhonci, crackles Cardiovascular: Regular rate and rhythm. No murmurs, gallops or rubs. Abdomen:Soft. Bowel sounds present. Non-tender.  Extremities: No lower extremity edema. Pulses are 2 + in the bilateral DP/PT.  Echo 08/09/13: Left ventricle: The cavity size was normal. Systolic function was normal. The estimated ejection fraction was in the range of 55% to 60%. Wall motion was normal; there were no regional wall motion abnormalities. Left ventricular diastolic function parameters were normal.  Assessment and Plan:   1. Chest pain: Atypical. I do not think it is cardiac related. Occurred during rest after heavy meal while lying in bed. LIkely GERD. Resolved with PPI. Echo with normal LV size and function, no structural defects.    2. Cardiomegaly on Chest xray: Echo normal.

## 2014-06-19 ENCOUNTER — Ambulatory Visit (INDEPENDENT_AMBULATORY_CARE_PROVIDER_SITE_OTHER): Payer: BLUE CROSS/BLUE SHIELD | Admitting: Family Medicine

## 2014-06-19 VITALS — BP 120/80 | HR 95 | Temp 99.4°F | Resp 18 | Ht 63.0 in | Wt 232.2 lb

## 2014-06-19 DIAGNOSIS — J209 Acute bronchitis, unspecified: Secondary | ICD-10-CM | POA: Diagnosis not present

## 2014-06-19 MED ORDER — AZITHROMYCIN 250 MG PO TABS: ORAL_TABLET | ORAL | Status: DC

## 2014-06-19 MED ORDER — HYDROCODONE-HOMATROPINE 5-1.5 MG/5ML PO SYRP: 5.0000 mL | ORAL_SOLUTION | Freq: Three times a day (TID) | ORAL | Status: DC | PRN

## 2014-06-19 NOTE — Progress Notes (Signed)
Patient ID: Connie Dennis, female   DOB: 04/28/1985, 29 y.o.   MRN: [id number]   This chart was scribed for Elvina Sidle, MD by [facility], medical scribe at [facility].The patient was seen in exam room 04 and the patient's care was started at 8:47 PM.  Patient ID: Connie Dennis MRN: [id number], DOB: 02-Jan-1986, 29 y.o. Date of Encounter: 06/19/2014  Primary Physician: No primary care provider on file.  Chief Complaint:  Chief Complaint  Patient presents with   Cough    Since Sunday-causing back to hurt   Dizziness    started this afternoon-off balanced   HPI:  Connie Dennis is a 29 y.o. female who presents to [facility] complaining of a gradually worsening cough, onset three days ago. Her cough has produced some phlegm but mostly a dry cough. This cough has kept her up at night. Also complains of fatigue and dizziness. Sunday she did have chills but this has improved. Pt has a history of diabetes, she was diagnosed last year. Pt also has a history of asthma. Pt is a Runner, broadcasting/film/video at [employer].  Past Medical History  Diagnosis Date   Polycystic ovarian syndrome    Depression     Home Meds: Prior to Admission medications   Medication Sig Start Date End Date Taking? Authorizing Provider  lisinopril (PRINIVIL,ZESTRIL) 5 MG tablet Take 5 mg by mouth daily.   Yes Historical Provider, MD  metFORMIN (GLUCOPHAGE) 500 MG tablet Take 500 mg by mouth 2 (two) times daily with a meal.   Yes Historical Provider, MD   Allergies: No Known Allergies  History   Social History   Marital Status: Single    Spouse Name: N/A   Number of Children: 0   Years of Education: N/A   Occupational History   Investment banker, operational    Social History Main Topics   Smoking status: Never Smoker    Smokeless tobacco: Not on file   Alcohol Use: No   Drug Use: No   Sexual Activity: Yes   Other Topics Concern     Not on file   Social History Narrative    Review of Systems: Constitutional: negative for chills, fever, night sweats, weight changes. Positive for fatigue HEENT: negative for vision changes, hearing loss, congestion, rhinorrhea, ST, epistaxis, or sinus pressure Cardiovascular: negative for chest pain or palpitations Respiratory: negative for hemoptysis, wheezing, shortness of breath. Positive for a cough Abdominal: negative for abdominal pain, nausea, vomiting, diarrhea, or constipation Dermatological: negative for rash Neurologic: negative for headache or syncope. Positive for dizziness. All other systems reviewed and are otherwise negative with the exception to those above and in the HPI.  Physical Exam: Blood pressure 120/80, pulse 95, temperature 99.4 F (37.4 C), temperature source Oral, resp. rate 18, height  (1.6 m), weight 232 lb 4 oz (105.348 kg), SpO2 94 %., Body mass index is 41.15 kg/(m^2). General: Well developed, well nourished, in no acute distress. Head: Normocephalic, atraumatic, eyes without discharge, sclera non-icteric, nares are without discharge. Bilateral auditory canals clear, TM's are without perforation, pearly grey and translucent with reflective cone of light bilaterally. Oral cavity moist, posterior pharynx without exudate, erythema, peritonsillar abscess, or post nasal drip.  Neck: Supple. No thyromegaly. Full ROM. No lymphadenopathy. Lungs: Clear bilaterally to auscultation without wheezes, rales, or rhonchi. Breathing is unlabored. Few expiratory wheezes. Heart: RRR with S1 S2. No murmurs, rubs, or gallops appreciated. Abdomen: Soft, non-tender,  non-distended with normoactive bowel sounds. No hepatomegaly. No rebound/guarding. No obvious abdominal masses. Msk:  Strength and tone normal for age. Extremities/Skin: Warm and dry. No clubbing or cyanosis. No edema. No rashes or suspicious lesions. Neuro: Alert and oriented X 3. Moves all extremities  spontaneously. Gait is normal. CNII-XII grossly in tact. Psych:  Responds to questions appropriately with a normal affect.    ASSESSMENT AND PLAN:  29 y.o. year old female with   This chart was scribed in my presence and reviewed by me personally.    ICD-9-CM ICD-10-CM   1. Acute bronchitis, unspecified organism 466.0 J20.9 azithromycin (ZITHROMAX Z-PAK) 250 MG tablet     HYDROcodone-homatropine (HYCODAN) 5-1.5 MG/5ML syrup     Signed, Elvina SidleKurt Lauenstein, MD  Signed, Elvina SidleKurt Lauenstein, MD 06/19/2014 8:46 PM

## 2014-06-19 NOTE — Patient Instructions (Signed)

## 2019-03-26 ENCOUNTER — Ambulatory Visit: Payer: Self-pay | Attending: Internal Medicine

## 2019-03-26 DIAGNOSIS — Z23 Encounter for immunization: Secondary | ICD-10-CM | POA: Insufficient documentation

## 2019-03-26 NOTE — Progress Notes (Signed)
   Covid-19 Vaccination Clinic  Name:  Connie Dennis    MRN: [id number] DOB: [birth year]  03/26/2019  [name] was observed post Covid-19 immunization for 15 minutes without incidence. She was provided with Vaccine Information Sheet and instruction to access the V-Safe system.   [name] was instructed to call 911 with any severe reactions post vaccine: Marland Kitchen Difficulty breathing  . Swelling of your face and throat  . A fast heartbeat  . A bad rash all over your body  . Dizziness and weakness    Immunizations Administered    Name Date Dose VIS Date Route   Pfizer COVID-19 Vaccine 03/26/2019  9:26 AM 0.3 mL 01/06/2019 Intramuscular   Manufacturer: [employer], [employer]   Lot: [id number]   NDC: [address]-4

## 2019-04-22 ENCOUNTER — Ambulatory Visit: Payer: Self-pay

## 2021-09-19 ENCOUNTER — Telehealth: Payer: BC Managed Care – PPO | Admitting: Physician Assistant

## 2021-09-19 DIAGNOSIS — U071 COVID-19: Secondary | ICD-10-CM | POA: Diagnosis not present

## 2021-09-19 MED ORDER — MOLNUPIRAVIR EUA 200MG CAPSULE: 4.0000 | ORAL_CAPSULE | Freq: Two times a day (BID) | ORAL | 0 refills | Status: AC

## 2021-09-19 NOTE — Progress Notes (Signed)
Virtual Visit Consent   Venetta Knee, you are scheduled for a virtual visit with a [facility] provider today. Just as with appointments in the office, your consent must be obtained to participate. Your consent will be active for this visit and any virtual visit you may have with one of our providers in the next 365 days. If you have a MyChart account, a copy of this consent can be sent to you electronically.  As this is a virtual visit, video technology does not allow for your provider to perform a traditional examination. This may limit your provider's ability to fully assess your condition. If your provider identifies any concerns that need to be evaluated in person or the need to arrange testing (such as labs, EKG, etc.), we will make arrangements to do so. Although advances in technology are sophisticated, we cannot ensure that it will always work on either your end or our end. If the connection with a video visit is poor, the visit may have to be switched to a telephone visit. With either a video or telephone visit, we are not always able to ensure that we have a secure connection.  By engaging in this virtual visit, you consent to the provision of healthcare and authorize for your insurance to be billed (if applicable) for the services provided during this visit. Depending on your insurance coverage, you may receive a charge related to this service.  I need to obtain your verbal consent now. Are you willing to proceed with your visit today? Connie Dennis has provided verbal consent on 09/19/2021 for a virtual visit (video or telephone). Margaretann Loveless, PA-C  Date: 09/19/2021 9:01 AM  Virtual Visit via Video Note   I, Margaretann Loveless, connected with  Connie Dennis  ([id number], 1985/08/17) on 09/19/21 at  8:45 AM EDT by a video-enabled telemedicine application and verified that I am speaking with the correct person using two identifiers.  Location: Patient: Virtual Visit Location  Patient: Home Provider: Virtual Visit Location Provider: Home Office   I discussed the limitations of evaluation and management by telemedicine and the availability of in person appointments. The patient expressed understanding and agreed to proceed.    History of Present Illness: Connie Dennis is a 36 y.o. who identifies as a female who was assigned female at birth, and is being seen today for Covid 10.  HPI: URI  This is a new problem. Episode onset: Symptoms started between Tuesday and yesterday; tested positive for covid yesterday. The problem has been gradually worsening. There has been no fever. Associated symptoms include abdominal pain (bloated and burping), congestion, coughing (slight), headaches, sinus pain and a sore throat (scratchy throat now improved). Pertinent negatives include no diarrhea, ear pain, nausea, plugged ear sensation, rhinorrhea or vomiting. Associated symptoms comments: Fatigue, body aches, burning in nose, post nasal drainage. She has tried NSAIDs, increased fluids and decongestant (nasal spray) for the symptoms. The treatment provided no relief.      Problems:  Patient Active Problem List   Diagnosis Date Noted   Chest pain 07/25/2013    Allergies: No Known Allergies Medications:  Current Outpatient Medications:    molnupiravir EUA (LAGEVRIO) 200 mg CAPS capsule, Take 4 capsules (800 mg total) by mouth 2 (two) times daily for 5 days., Disp: 40 capsule, Rfl: 0   azithromycin (ZITHROMAX Z-PAK) 250 MG tablet, 2 today, then 1 daily until finished, Disp: 6 each, Rfl: 0   HYDROcodone-homatropine (HYCODAN) 5-1.5 MG/5ML syrup, Take 5 mLs by mouth  every 8 (eight) hours as needed for cough., Disp: 120 mL, Rfl: 0   lisinopril (PRINIVIL,ZESTRIL) 5 MG tablet, Take 5 mg by mouth daily., Disp: , Rfl:    metFORMIN (GLUCOPHAGE) 500 MG tablet, Take 500 mg by mouth 2 (two) times daily with a meal., Disp: , Rfl:   Observations/Objective: Patient is well-developed,  well-nourished in no acute distress.  Resting comfortably at home.  Head is normocephalic, atraumatic.  No labored breathing.  Speech is clear and coherent with logical content.  Patient is alert and oriented at baseline.    Assessment and Plan: 1. COVID-19 - molnupiravir EUA (LAGEVRIO) 200 mg CAPS capsule; Take 4 capsules (800 mg total) by mouth 2 (two) times daily for 5 days.  Dispense: 40 capsule; Refill: 0  - Continue OTC symptomatic management of choice - Will send OTC vitamins and supplement information through AVS - Molnupiravir prescribed - Patient enrolled in MyChart symptom monitoring - Push fluids - Rest as needed - Discussed return precautions and when to seek in-person evaluation, sent via AVS as well   Follow Up Instructions: I discussed the assessment and treatment plan with the patient. The patient was provided an opportunity to ask questions and all were answered. The patient agreed with the plan and demonstrated an understanding of the instructions.  A copy of instructions were sent to the patient via MyChart unless otherwise noted below.    The patient was advised to call back or seek an in-person evaluation if the symptoms worsen or if the condition fails to improve as anticipated.  Time:  I spent 15 minutes with the patient via telehealth technology discussing the above problems/concerns.    Margaretann Loveless, PA-C

## 2021-09-19 NOTE — Patient Instructions (Signed)
Burman Nieves, thank you for joining Margaretann Loveless, PA-C for today's virtual visit.  While this provider is not your primary care provider (PCP), if your PCP is located in our provider database this encounter information will be shared with them immediately following your visit.  Consent: (Patient) Connie Dennis provided verbal consent for this virtual visit at the beginning of the encounter.  Current Medications:  Current Outpatient Medications:    molnupiravir EUA (LAGEVRIO) 200 mg CAPS capsule, Take 4 capsules (800 mg total) by mouth 2 (two) times daily for 5 days., Disp: 40 capsule, Rfl: 0   azithromycin (ZITHROMAX Z-PAK) 250 MG tablet, 2 today, then 1 daily until finished, Disp: 6 each, Rfl: 0   HYDROcodone-homatropine (HYCODAN) 5-1.5 MG/5ML syrup, Take 5 mLs by mouth every 8 (eight) hours as needed for cough., Disp: 120 mL, Rfl: 0   lisinopril (PRINIVIL,ZESTRIL) 5 MG tablet, Take 5 mg by mouth daily., Disp: , Rfl:    metFORMIN (GLUCOPHAGE) 500 MG tablet, Take 500 mg by mouth 2 (two) times daily with a meal., Disp: , Rfl:    Medications ordered in this encounter:  Meds ordered this encounter  Medications   molnupiravir EUA (LAGEVRIO) 200 mg CAPS capsule    Sig: Take 4 capsules (800 mg total) by mouth 2 (two) times daily for 5 days.    Dispense:  40 capsule    Refill:  0    Order Specific Question:   Supervising Provider    Answer:   Hyacinth Meeker, BRIAN [[id number]]     *If you need refills on other medications prior to your next appointment, please contact your pharmacy*  Follow-Up: Call back or seek an in-person evaluation if the symptoms worsen or if the condition fails to improve as anticipated.  Other Instructions Can take to lessen severity: Vit C 500mg  twice daily Quercertin 250-500mg  twice daily Zinc 75-100mg  daily Melatonin 3-6 mg at bedtime Vit D3 1000-2000 IU daily Aspirin 81 mg daily with food Optional: Famotidine 20mg  daily Also can add tylenol/ibuprofen as  needed for fevers and body aches May add Mucinex or Mucinex DM as needed for cough/congestion  Quarantine and Isolation Quarantine and isolation refer to local and travel restrictions to protect the public and travelers from contagious diseases that constitute a public health threat. Contagious diseases are diseases that can spread from one person to another. Quarantine and isolation help to protect the public by preventing exposure to people who have or may have a contagious disease. Isolation separates people who are sick with a contagious disease from people who are not sick. Quarantine separates and restricts the movement of people who were exposed to a contagious disease to see if they become sick. You may be put in quarantine or isolation if you have been exposed to or diagnosed with any of the following diseases: Severe acute respiratory syndromes, such as COVID-19. Cholera. Diphtheria. Tuberculosis. Plague. Smallpox. Yellow fever. Viral hemorrhagic fevers, such as [place], Ebola, and [place]. When to quarantine or isolate Follow these rules, whether you have been vaccinated or not: Stay home and isolate from others when you are sick with a contagious disease. Isolate when you test positive for a contagious disease, even if you do not have symptoms. Isolate if you are sick and suspect that you may have a contagious disease. If you suspect that you have a contagious disease, get tested. If your test results are negative, you can end your isolation. If your test results are positive, follow the full isolation recommendations as  told by your health care provider or local health authorities. Quarantine and stay away from others when you have been in close contact with someone who has tested positive for a contagious disease. Close contact is defined as being less than 6 ft (1.8 m) away from an infected person for a total of 15 minutes or more over a 24-hour period. Do not go to  places where you are unable to wear a mask, such as restaurants and some gyms. Stay home and separate from others as much as possible. Avoid being around people who may get very sick from the contagious disease that you have. Use a separate bathroom, if possible. Do not travel. For travel guidance, visit the CDC's travel webpage at http://espinoza.biz/ Follow these instructions at home: Medicines  Take over-the-counter and prescription medicines as told by your health care provider. Finish all antibiotic medicine even when you start to feel better. Stay up to date with all your vaccines. Get scheduled vaccines and boosters as recommended by your health care provider. Lifestyle Wear a high-quality mask if you must be around others at home and in public, if recommended. Improve air flow (ventilation) at home to help prevent the disease from spreading to other people, if possible. Do not share personal household items, like cups, towels, and utensils. Practice everyday hygiene and cleaning. General instructions Talk to your health care provider if you have a weakened body defense system (immune system). People with a weakened immune system may have a reduced immune response to vaccines. You may need to follow current prevention measures, including wearing a well-fitting mask, avoiding crowds, and avoiding poorly ventilated indoor places. Monitor symptoms and follow health care provider instructions, which may include resting, drinking fluids, and taking medicines. Follow specific isolation and quarantine recommendations if you are in places that can lead to disease outbreaks, such as correctional and detention facilities, homeless shelters, and cruise ships. Return to your normal activities as told by your health care provider. Ask your health care provider what activities are safe for you. Keep all follow-up visits. This is important. Where to find more information CDC:  https://martinez.com/ Contact a health care provider if: You have a fever. You have signs and symptoms that return or get worse after isolation. Get help right away if: You have difficulty breathing. You have chest pain. These symptoms may be an emergency. Get help right away. Call 911. Do not wait to see if the symptoms will go away. Do not drive yourself to the hospital. Summary Isolation and quarantine help protect the public by preventing exposure to people who have or may have a contagious disease. Isolate when you are sick or when you test positive, even if you do not have symptoms. Quarantine and stay away from others when you have been in close contact with someone who has tested positive for a contagious disease. This information is not intended to replace advice given to you by your health care provider. Make sure you discuss any questions you have with your health care provider. Document Revised: 01/23/2021 Document Reviewed: 01/02/2021 Elsevier Patient Education  2023 Elsevier Inc.    If you have been instructed to have an in-person evaluation today at a local Urgent Care facility, please use the link below. It will take you to a list of all of our available [facility], including address, phone number and hours of operation. Please do not delay care.  [facility]  If you or a family member do  not have a primary care provider, use the link below to schedule a visit and establish care. When you choose a [facility] primary care physician or advanced practice provider, you gain a long-term partner in health. Find a Primary Care Provider  Learn more about [facility]'s in-office and virtual care options: [facility]

## 2022-09-21 ENCOUNTER — Encounter: Payer: Self-pay | Admitting: Nurse Practitioner

## 2022-09-21 ENCOUNTER — Telehealth: Payer: BC Managed Care – PPO | Admitting: Nurse Practitioner

## 2022-09-21 DIAGNOSIS — J029 Acute pharyngitis, unspecified: Secondary | ICD-10-CM | POA: Diagnosis not present

## 2022-09-21 NOTE — Progress Notes (Signed)
Virtual Visit Consent   Connie Dennis, you are scheduled for a virtual visit with a [facility] provider today. Just as with appointments in the office, your consent must be obtained to participate. Your consent will be active for this visit and any virtual visit you may have with one of our providers in the next 365 days. If you have a MyChart account, a copy of this consent can be sent to you electronically.  As this is a virtual visit, video technology does not allow for your provider to perform a traditional examination. This may limit your provider's ability to fully assess your condition. If your provider identifies any concerns that need to be evaluated in person or the need to arrange testing (such as labs, EKG, etc.), we will make arrangements to do so. Although advances in technology are sophisticated, we cannot ensure that it will always work on either your end or our end. If the connection with a video visit is poor, the visit may have to be switched to a telephone visit. With either a video or telephone visit, we are not always able to ensure that we have a secure connection.  By engaging in this virtual visit, you consent to the provision of healthcare and authorize for your insurance to be billed (if applicable) for the services provided during this visit. Depending on your insurance coverage, you may receive a charge related to this service.  I need to obtain your verbal consent now. Are you willing to proceed with your visit today? Connie Dennis has provided verbal consent on 09/21/2022 for a virtual visit (video or telephone). Connie Simas, FNP  Date: 09/21/2022 7:21 PM  Virtual Visit via Video Note   I, Connie Dennis, connected with  Connie Dennis  ([id number], 1985/02/01) on 09/21/22 at  7:30 PM EDT by a video-enabled telemedicine application and verified that I am speaking with the correct person using two identifiers.  Location: Patient: Virtual Visit Location Patient:  Home Provider: Virtual Visit Location Provider: Home Office   I discussed the limitations of evaluation and management by telemedicine and the availability of in person appointments. The patient expressed understanding and agreed to proceed.    History of Present Illness: Connie Dennis is a 37 y.o. 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    Problems:  Patient Active Problem List   Diagnosis Date Noted   Chest pain 07/25/2013    Allergies: No Known Allergies Medications:  Current Outpatient Medications:    azithromycin (ZITHROMAX Z-PAK) 250 MG tablet, 2 today, then 1 daily until finished, Disp: 6 each, Rfl: 0   HYDROcodone-homatropine (HYCODAN) 5-1.5 MG/5ML syrup, Take 5 mLs by mouth every 8 (eight) hours as needed for cough., Disp: 120 mL, Rfl: 0   lisinopril (PRINIVIL,ZESTRIL) 5 MG tablet, Take 5 mg by mouth daily., Disp: , Rfl:    metFORMIN (GLUCOPHAGE) 500 MG tablet, Take 500 mg by mouth 2 (two) times daily with a meal., Disp: , Rfl:   Observations/Objective: Patient is well-developed, well-nourished in no acute distress.  Resting comfortably  at home.  Head is normocephalic, atraumatic.  No labored breathing.  Speech is clear and coherent with logical content.  Patient is alert and oriented at baseline.    Assessment and Plan:  1. Pharyngitis, unspecified etiology Discussed likely viral in nature  Encouraged repeat COVID test in am   Recommended OTC medications for relief Warm salt water gargles, soup/tea  Follow up if COVID repeat testing is positive or with  new/worsening symptoms as discussed      Follow Up Instructions: I discussed the assessment and treatment plan with the patient. The patient was provided an opportunity to ask questions and all were answered. The patient agreed with the plan and demonstrated an understanding of the instructions.  A copy of instructions were sent to the patient via MyChart unless otherwise noted below.    The patient was advised to call back or seek an in-person evaluation if the symptoms worsen or if the condition fails to improve as anticipated.  Time:  I spent 10 minutes with the patient via telehealth technology discussing the above problems/concerns.    Connie Simas, FNP

## 2022-09-23 ENCOUNTER — Encounter: Payer: Self-pay | Admitting: Physician Assistant

## 2022-12-30 ENCOUNTER — Telehealth: Payer: BC Managed Care – PPO | Admitting: Physician Assistant

## 2022-12-30 DIAGNOSIS — J209 Acute bronchitis, unspecified: Secondary | ICD-10-CM

## 2022-12-30 MED ORDER — PROMETHAZINE-DM 6.25-15 MG/5ML PO SYRP: 5.0000 mL | ORAL_SOLUTION | Freq: Four times a day (QID) | ORAL | 0 refills | Status: AC | PRN

## 2022-12-30 MED ORDER — AZITHROMYCIN 250 MG PO TABS: ORAL_TABLET | ORAL | 0 refills | Status: AC

## 2022-12-30 NOTE — Patient Instructions (Signed)
  Burman Nieves, thank you for joining Piedad Climes, PA-C for today's virtual visit.  While this provider is not your primary care provider (PCP), if your PCP is located in our provider database this encounter information will be shared with them immediately following your visit.   A [facility] MyChart account gives you access to today's visit and all your visits, tests, and labs performed at [facility] " click here if you don't have a [facility] MyChart account or go to [email or web address]  Consent: (Patient) Connie Dennis provided verbal consent for this virtual visit at the beginning of the encounter.  Current Medications:  Current Outpatient Medications:    azithromycin (ZITHROMAX Z-PAK) 250 MG tablet, 2 today, then 1 daily until finished, Disp: 6 each, Rfl: 0   HYDROcodone-homatropine (HYCODAN) 5-1.5 MG/5ML syrup, Take 5 mLs by mouth every 8 (eight) hours as needed for cough., Disp: 120 mL, Rfl: 0   lisinopril (PRINIVIL,ZESTRIL) 5 MG tablet, Take 5 mg by mouth daily., Disp: , Rfl:    metFORMIN (GLUCOPHAGE) 500 MG tablet, Take 500 mg by mouth 2 (two) times daily with a meal., Disp: , Rfl:    Medications ordered in this encounter:  No orders of the defined types were placed in this encounter.    *If you need refills on other medications prior to your next appointment, please contact your pharmacy*  Follow-Up: Call back or seek an in-person evaluation if the symptoms worsen or if the condition fails to improve as anticipated.  [facility] [phone]  Other Instructions Please keep hydrated and rest. You can alternate Tylenol and ibuprofen as needed for any throat pain, aches, headache.  IF you have a humidifier, run it in the bedroom at night. Use Mucinex OTC to help thin out congestion. Start daily antihistamine -- Claritin or Zyrtec -- as well. I have sent in Flonase and a cough medication as well. Take COVID test as discussed to  be cautious. If positive, let us know ASAP.  Otherwise if negative, especially with progressing symptoms, take the Azithromycin as directed.   If you have been instructed to have an in-person evaluation today at a local Urgent Care facility, please use the link below. It will take you to a list of all of our available [facility], including address, phone number and hours of operation. Please do not delay care.  [facility]  If you or a family member do not have a primary care provider, use the link below to schedule a visit and establish care. When you choose a [facility] primary care physician or advanced practice provider, you gain a long-term partner in health. Find a Primary Care Provider  Learn more about [facility]'s in-office and virtual care options:  - Get Care Now

## 2022-12-30 NOTE — Progress Notes (Signed)
Virtual Visit Consent   Connie Dennis, you are scheduled for a virtual visit with a [facility] provider today. Just as with appointments in the office, your consent must be obtained to participate. Your consent will be active for this visit and any virtual visit you may have with one of our providers in the next 365 days. If you have a MyChart account, a copy of this consent can be sent to you electronically.  As this is a virtual visit, video technology does not allow for your provider to perform a traditional examination. This may limit your provider's ability to fully assess your condition. If your provider identifies any concerns that need to be evaluated in person or the need to arrange testing (such as labs, EKG, etc.), we will make arrangements to do so. Although advances in technology are sophisticated, we cannot ensure that it will always work on either your end or our end. If the connection with a video visit is poor, the visit may have to be switched to a telephone visit. With either a video or telephone visit, we are not always able to ensure that we have a secure connection.  By engaging in this virtual visit, you consent to the provision of healthcare and authorize for your insurance to be billed (if applicable) for the services provided during this visit. Depending on your insurance coverage, you may receive a charge related to this service.  I need to obtain your verbal consent now. Are you willing to proceed with your visit today? Connie Dennis has provided verbal consent on 12/30/2022 for a virtual visit (video or telephone). Connie Dennis, [place]  Date: 12/30/2022 8:37 AM  Virtual Visit via Video Note   I, Connie Dennis, connected with  Connie Dennis  ([id number], Oct 02, 1985) on 12/30/22 at  8:15 AM EST by a video-enabled telemedicine application and verified that I am speaking with the correct person using two identifiers.  Location: Patient: Virtual Visit Location  Patient: Home Provider: Virtual Visit Location Provider: Home Office   I discussed the limitations of evaluation and management by telemedicine and the availability of in person appointments. The patient expressed understanding and agreed to proceed.    History of Present Illness: Connie Dennis is a 37 y.o. who identifies as a female who was assigned female at birth, and is being seen today for nasal congestion, PND with tickle in throat and spasms of coughing. Initially dry but now productive and associated with mild aches. Denies fever. Denies sinus pain or tooth pain. Chest is sore from coughing but denies overt chest pain. Denies SOB. Denies travel. Works as a Runner, broadcasting/film/video so unknown exposures. Has not tested for COVID yet.  HPI: HPI  Problems:  Patient Active Problem List   Diagnosis Date Noted   Chest pain 07/25/2013    Allergies: No Known Allergies Medications:  Current Outpatient Medications:    azithromycin (ZITHROMAX) 250 MG tablet, Take 2 tablets on day 1, then 1 tablet daily on days 2 through 5, Disp: 6 tablet, Rfl: 0   lisinopril (ZESTRIL) 5 MG tablet, Take 1 tablet by mouth daily., Disp: , Rfl:    metFORMIN (GLUCOPHAGE) 500 MG tablet, 1 tablet with a meal Orally Once a day for 30 day(s), Disp: , Rfl:    OZEMPIC, 1 MG/DOSE, 4 MG/3ML SOPN, Inject into the skin., Disp: , Rfl:    promethazine-dextromethorphan (PROMETHAZINE-DM) 6.25-15 MG/5ML syrup, Take 5 mLs by mouth 4 (four) times daily as needed for cough., Disp: 118 mL, Rfl:  0   lisinopril (PRINIVIL,ZESTRIL) 5 MG tablet, Take 5 mg by mouth daily., Disp: , Rfl:    metFORMIN (GLUCOPHAGE) 500 MG tablet, Take 500 mg by mouth 2 (two) times daily with a meal., Disp: , Rfl:   Observations/Objective: Patient is well-developed, well-nourished in no acute distress.  Resting comfortably at home.  Head is normocephalic, atraumatic.  No labored breathing. Speech is clear and coherent with logical content.  Patient is alert and oriented  at baseline.   Assessment and Plan: 1. Acute bronchitis, unspecified organism - promethazine-dextromethorphan (PROMETHAZINE-DM) 6.25-15 MG/5ML syrup; Take 5 mLs by mouth 4 (four) times daily as needed for cough.  Dispense: 118 mL; Refill: 0 - azithromycin (ZITHROMAX) 250 MG tablet; Take 2 tablets on day 1, then 1 tablet daily on days 2 through 5  Dispense: 6 tablet; Refill: 0  Discussed viral versus bacterial etiologies. Want her to test for COVID as a precaution but giving worsening symptoms and change in sputum, will add on Azithromycin. Supportive measures and OTC medications reviewed. Promethazine-DM per orders  Follow Up Instructions: I discussed the assessment and treatment plan with the patient. The patient was provided an opportunity to ask questions and all were answered. The patient agreed with the plan and demonstrated an understanding of the instructions.  A copy of instructions were sent to the patient via MyChart unless otherwise noted below.   The patient was advised to call back or seek an in-person evaluation if the symptoms worsen or if the condition fails to improve as anticipated.    Connie Climes, PA-C
